# Patient Record
Sex: Male | Born: 1985 | Hispanic: No | Marital: Single | Smoking: Current every day smoker
Health system: Southern US, Community
[De-identification: ages and names within clinical notes are randomized; demographics above are authoritative.]

---

## 2015-04-05 ENCOUNTER — Encounter (HOSPITAL_COMMUNITY): Payer: Self-pay | Admitting: *Deleted

## 2015-04-05 ENCOUNTER — Emergency Department (HOSPITAL_COMMUNITY)
Admission: EM | Admit: 2015-04-05 | Discharge: 2015-04-06 | Disposition: A | Discharge status: Home / Self Care | Payer: Self-pay | Attending: Emergency Medicine | Admitting: Emergency Medicine

## 2015-04-05 DIAGNOSIS — N5089 Other specified disorders of the male genital organs: Secondary | ICD-10-CM

## 2015-04-05 DIAGNOSIS — N451 Epididymitis: Secondary | ICD-10-CM | POA: Insufficient documentation

## 2015-04-05 MED ORDER — MORPHINE SULFATE (PF) 4 MG/ML IV SOLN
4.0000 mg | Freq: Once | INTRAVENOUS | Status: AC
  Administered 2015-04-06: 4 mg via INTRAVENOUS
  Filled 2015-04-05: qty 1

## 2015-04-05 MED ORDER — ONDANSETRON HCL 4 MG/2ML IJ SOLN
4.0000 mg | Freq: Once | INTRAMUSCULAR | Status: AC
  Administered 2015-04-06: 4 mg via INTRAVENOUS
  Filled 2015-04-05: qty 2

## 2015-04-05 NOTE — ED Provider Notes (Signed)
CSN: [id number]     Arrival date & time 04/05/15  2322 History   First MD Initiated Contact with Patient 04/05/15 2326     Chief Complaint  Patient presents with  . Abdominal Pain  . Groin Pain     (Consider location/radiation/quality/duration/timing/severity/associated sxs/prior Treatment) HPI Comments: Pt comes in with c/o left testicle swelling and pain that started yesterday. No history of similar symptoms. No vomiting, diarrhea, fever or dysuria. Nothing makes the pain better. Denies penile discharge.  The history is provided by the patient. A language interpreter was used.    History reviewed. No pertinent past medical history. History reviewed. No pertinent past surgical history. No family history on file. Social History  Substance Use Topics  . Smoking status: Never Smoker   . Smokeless tobacco: Never Used  . Alcohol Use: No    Review of Systems  All other systems reviewed and are negative.     Allergies  Review of patient's allergies indicates no known allergies.  Home Medications   Prior to Admission medications   Not on File   BP 151/93 mmHg  Pulse 86  Temp(Src) 99.1 F (37.3 C) (Oral)  Resp 16  SpO2 93% Physical Exam  Constitutional: He is oriented to person, place, and time. He appears well-developed and well-nourished.  HENT:  Head: Normocephalic and atraumatic.  Cardiovascular: Normal rate and regular rhythm.   Pulmonary/Chest: Effort normal and breath sounds normal.  Abdominal: Soft. Bowel sounds are normal.  Left sided testicle pain with left abdominal palpation. No hernia noted  Genitourinary:  Swelling noted to the left testing. No penile discharge noted  Musculoskeletal: Normal range of motion.  Neurological: He is alert and oriented to person, place, and time.  Skin: Skin is warm and dry.  Psychiatric: He has a normal mood and affect.  Nursing note and vitals reviewed.   ED Course  Procedures (including critical care time) Labs  Review Labs Reviewed  CBC WITH DIFFERENTIAL/PLATELET - Abnormal; Notable for the following:    Monocytes Relative 14 (*)    Monocytes Absolute 1.2 (*)    All other components within normal limits  BASIC METABOLIC PANEL  URINALYSIS, ROUTINE W REFLEX MICROSCOPIC (NOT AT [facility])  HIV ANTIBODY (ROUTINE TESTING)  RPR  GC/CHLAMYDIA PROBE AMP ([place]) NOT AT [facility]    Imaging Review No results found. I have personally reviewed and evaluated these images and lab results as part of my medical decision-making.   EKG Interpretation None      MDM   Final diagnoses:  None    Pt left with Earley Favor NP for pending urine and [place] report    Teressa Lower, NP 04/06/15 [id number]  Derwood Kaplan, MD 04/07/15 [id number]

## 2015-04-05 NOTE — ED Notes (Signed)
Bed: ZO10 Expected date:  Expected time:  Means of arrival:  Comments: EMS 29yo M abd / groin pain

## 2015-04-05 NOTE — ED Notes (Signed)
Pt arrives to the ER via EMS for complaints of lower abd pain and testicle pain; pt states that the pain started yesterday around 1700 and has progressively gotten worse; pt reports left testicular swelling; tenderness to lower abd on palpation

## 2015-04-06 ENCOUNTER — Emergency Department (HOSPITAL_COMMUNITY): Payer: Self-pay

## 2015-04-06 LAB — URINALYSIS, ROUTINE W REFLEX MICROSCOPIC
BILIRUBIN URINE: NEGATIVE
Glucose, UA: NEGATIVE mg/dL
KETONES UR: NEGATIVE mg/dL
Leukocytes, UA: NEGATIVE
NITRITE: NEGATIVE
Protein, ur: NEGATIVE mg/dL
Specific Gravity, Urine: 1.009 (ref 1.005–1.030)
UROBILINOGEN UA: 0.2 mg/dL (ref 0.0–1.0)
pH: 6 (ref 5.0–8.0)

## 2015-04-06 LAB — CBC WITH DIFFERENTIAL/PLATELET
BASOS ABS: 0 10*3/uL (ref 0.0–0.1)
BASOS PCT: 0 % (ref 0–1)
EOS PCT: 1 % (ref 0–5)
Eosinophils Absolute: 0.1 10*3/uL (ref 0.0–0.7)
HCT: 43.5 % (ref 39.0–52.0)
Hemoglobin: 15.1 g/dL (ref 13.0–17.0)
Lymphocytes Relative: 20 % (ref 12–46)
Lymphs Abs: 1.7 10*3/uL (ref 0.7–4.0)
MCH: 30 pg (ref 26.0–34.0)
MCHC: 34.7 g/dL (ref 30.0–36.0)
MCV: 86.5 fL (ref 78.0–100.0)
MONO ABS: 1.2 10*3/uL — AB (ref 0.1–1.0)
MONOS PCT: 14 % — AB (ref 3–12)
Neutro Abs: 5.9 10*3/uL (ref 1.7–7.7)
Neutrophils Relative %: 65 % (ref 43–77)
PLATELETS: 205 10*3/uL (ref 150–400)
RBC: 5.03 MIL/uL (ref 4.22–5.81)
RDW: 12 % (ref 11.5–15.5)
WBC: 8.9 10*3/uL (ref 4.0–10.5)

## 2015-04-06 LAB — URINE MICROSCOPIC-ADD ON

## 2015-04-06 LAB — BASIC METABOLIC PANEL
ANION GAP: 9 (ref 5–15)
BUN: 6 mg/dL (ref 6–20)
CALCIUM: 9 mg/dL (ref 8.9–10.3)
CO2: 23 mmol/L (ref 22–32)
CREATININE: 0.89 mg/dL (ref 0.61–1.24)
Chloride: 104 mmol/L (ref 101–111)
GFR calc Af Amer: >60 mL/min (ref >60)
GLUCOSE: 94 mg/dL (ref 65–99)
Potassium: 3.7 mmol/L (ref 3.5–5.1)
Sodium: 136 mmol/L (ref 135–145)

## 2015-04-06 LAB — HIV ANTIBODY (ROUTINE TESTING W REFLEX): HIV SCREEN 4TH GENERATION: NONREACTIVE

## 2015-04-06 LAB — RPR: RPR: NONREACTIVE

## 2015-04-06 MED ORDER — DOXYCYCLINE HYCLATE 100 MG PO TABS: 100.0000 mg | ORAL_TABLET | Freq: Two times a day (BID) | ORAL | Status: DC

## 2015-04-06 MED ORDER — CEFTRIAXONE SODIUM 250 MG IJ SOLR
250.0000 mg | Freq: Once | INTRAMUSCULAR | Status: AC
  Administered 2015-04-06: 250 mg via INTRAMUSCULAR
  Filled 2015-04-06: qty 250

## 2015-04-06 MED ORDER — LIDOCAINE HCL 2 % IJ SOLN
INTRAMUSCULAR | Status: AC
  Administered 2015-04-06: 400 mg
  Filled 2015-04-06: qty 20

## 2015-04-06 MED ORDER — TRAMADOL HCL 50 MG PO TABS: 50.0000 mg | ORAL_TABLET | Freq: Four times a day (QID) | ORAL | Status: DC | PRN

## 2015-04-06 MED ORDER — DOXYCYCLINE HYCLATE 100 MG PO TABS
100.0000 mg | ORAL_TABLET | Freq: Once | ORAL | Status: AC
  Administered 2015-04-06: 100 mg via ORAL
  Filled 2015-04-06: qty 1

## 2015-04-06 NOTE — ED Provider Notes (Signed)
Patient's ultrasound shows that he has epididymitis on the left.  He then given IM Rocephin and the first dose of doxycycline in the emergency department.  He has been given a prescription for doxycycline and Ultram for pain control, as well as referral to urology for follow-up I given the patient.  Discharge instructions in [place], as well as diagnosis and follow-up instructions in Marlane Mingle, NP 04/06/15 [id number]  Earley Favor, NP 04/06/15 5409  Marisa Severin, MD 04/06/15 [phone]

## 2015-04-06 NOTE — Discharge Instructions (Signed)
Epididymitis Epididymitis is a swelling (inflammation) of the epididymis. The epididymis is a cord-like structure along the back part of the testicle. Epididymitis is usually, but not always, caused by infection. This is usually a sudden problem beginning with chills, fever and pain behind the scrotum and in the testicle. There may be swelling and redness of the testicle. DIAGNOSIS  Physical examination will reveal a tender, swollen epididymis. Sometimes, cultures are obtained from the urine or from prostate secretions to help find out if there is an infection or if the cause is a different problem. Sometimes, blood work is performed to see if your white blood cell count is elevated and if a germ (bacterial) or viral infection is present. Using this knowledge, an appropriate medicine which kills germs (antibiotic) can be chosen by your caregiver. A viral infection causing epididymitis will most often go away (resolve) without treatment. HOME CARE INSTRUCTIONS   Hot sitz baths for 20 minutes, 4 times per day, may help relieve pain.  Only take over-the-counter or prescription medicines for pain, discomfort or fever as directed by your caregiver.  Take all medicines, including antibiotics, as directed. Take the antibiotics for the full prescribed length of time even if you are feeling better.  It is very important to keep all follow-up appointments. SEEK IMMEDIATE MEDICAL CARE IF:   You have a fever.  You have pain not relieved with medicines.  You have any worsening of your problems.  Your pain seems to come and go.  You develop pain, redness, and swelling in the scrotum and surrounding areas. MAKE SURE YOU:   Understand these instructions.  Will watch your condition.  Will get help right away if you are not doing well or get worse. Document Released: 07/30/2000 Document Revised: 10/25/2011 Document Reviewed: 06/19/2009 [facility] Patient Information 2015 [place], [place]. This information  is not intended to replace advice given to you by your health care provider. Make sure you discuss any questions you have with your health care provider. Ultrasound shows that you have epididymitis of the left testicle.  You have  Epididymitis Epididymitis is a swelling (inflammation) of the epididymis. The epididymis is a cord-like structure along the back part of the testicle. Epididymitis is usually, but not always, caused by infection. This is usually a sudden problem beginning with chills, fever and pain behind the scrotum and in the testicle. There may be swelling and redness of the testicle. DIAGNOSIS  Physical examination will reveal a tender, swollen epididymis. Sometimes, cultures are obtained from the urine or from prostate secretions to help find out if there is an infection or if the cause is a different problem. Sometimes, blood work is performed to see if your white blood cell count is elevated and if a germ (bacterial) or viral infection is present. Using this knowledge, an appropriate medicine which kills germs (antibiotic) can be chosen by your caregiver. A viral infection causing epididymitis will most often go away (resolve) without treatment. HOME CARE INSTRUCTIONS   Hot sitz baths for 20 minutes, 4 times per day, may help relieve pain.  Only take over-the-counter or prescription medicines for pain, discomfort or fever as directed by your caregiver.  Take all medicines, including antibiotics, as directed. Take the antibiotics for the full prescribed length of time even if you are feeling better.  It is very important to keep all follow-up appointments. SEEK IMMEDIATE MEDICAL CARE IF:   You have a fever.  You have pain not relieved with medicines.  You have any  worsening of your problems.  Your pain seems to come and go.  You develop pain, redness, and swelling in the scrotum and surrounding areas. MAKE SURE YOU:   Understand these instructions.  Will watch your  condition.  Will get help right away if you are not doing well or get worse. Document Released: 07/30/2000 Document Revised: 10/25/2011 Document Reviewed: 06/19/2009 [facility] Patient Information 2015 [place], [place]. This information is not intended to replace advice given to you by your health care provider. Make sure you discuss any questions you have with your health care provider.  been started on anti-biotics and given referral to urology for follow-up.  Please call on Monday to make an appointment.

## 2017-05-27 ENCOUNTER — Emergency Department (HOSPITAL_COMMUNITY)
Admission: EM | Admit: 2017-05-27 | Discharge: 2017-05-28 | Disposition: A | Discharge status: Home / Self Care | Payer: Self-pay | Attending: Emergency Medicine | Admitting: Emergency Medicine

## 2017-05-27 ENCOUNTER — Encounter (HOSPITAL_COMMUNITY): Payer: Self-pay | Admitting: Emergency Medicine

## 2017-05-27 DIAGNOSIS — R600 Localized edema: Secondary | ICD-10-CM | POA: Insufficient documentation

## 2017-05-27 DIAGNOSIS — Z5321 Procedure and treatment not carried out due to patient leaving prior to being seen by health care provider: Secondary | ICD-10-CM | POA: Insufficient documentation
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
## 2017-05-27 NOTE — ED Notes (Signed)
Pt became very upset he had to wait b/c he was sick and hurting.  RN explained that everybody was hurting.  He walked off and came back several minutes later handed me his stickers and said "have a good evening."

## 2017-12-30 ENCOUNTER — Other Ambulatory Visit: Payer: Self-pay

## 2017-12-30 ENCOUNTER — Encounter (HOSPITAL_COMMUNITY): Payer: Self-pay | Admitting: Emergency Medicine

## 2017-12-30 ENCOUNTER — Emergency Department (HOSPITAL_COMMUNITY)
Admission: EM | Admit: 2017-12-30 | Discharge: 2017-12-30 | Disposition: A | Discharge status: Home / Self Care | Payer: Self-pay | Attending: Emergency Medicine | Admitting: Emergency Medicine

## 2017-12-30 DIAGNOSIS — Z5321 Procedure and treatment not carried out due to patient leaving prior to being seen by health care provider: Secondary | ICD-10-CM | POA: Insufficient documentation

## 2017-12-30 DIAGNOSIS — R109 Unspecified abdominal pain: Secondary | ICD-10-CM | POA: Insufficient documentation

## 2017-12-30 LAB — CBC
HEMATOCRIT: 46 % (ref 39.0–52.0)
HEMOGLOBIN: 15.4 g/dL (ref 13.0–17.0)
MCH: 29.6 pg (ref 26.0–34.0)
MCHC: 33.5 g/dL (ref 30.0–36.0)
MCV: 88.5 fL (ref 78.0–100.0)
Platelets: 261 10*3/uL (ref 150–400)
RBC: 5.2 MIL/uL (ref 4.22–5.81)
RDW: 12.3 % (ref 11.5–15.5)
WBC: 4.4 10*3/uL (ref 4.0–10.5)

## 2017-12-30 LAB — URINALYSIS, ROUTINE W REFLEX MICROSCOPIC
BACTERIA UA: NONE SEEN
Bilirubin Urine: NEGATIVE
Glucose, UA: NEGATIVE mg/dL
Ketones, ur: NEGATIVE mg/dL
Leukocytes, UA: NEGATIVE
Nitrite: NEGATIVE
Protein, ur: NEGATIVE mg/dL
SPECIFIC GRAVITY, URINE: 1.012 (ref 1.005–1.030)
pH: 7 (ref 5.0–8.0)

## 2017-12-30 LAB — COMPREHENSIVE METABOLIC PANEL
ALBUMIN: 4.3 g/dL (ref 3.5–5.0)
ALK PHOS: 48 U/L (ref 38–126)
ALT: 37 U/L (ref 17–63)
ANION GAP: 9 (ref 5–15)
AST: 51 U/L — ABNORMAL HIGH (ref 15–41)
BUN: <5 mg/dL — ABNORMAL LOW (ref 6–20)
CALCIUM: 9.2 mg/dL (ref 8.9–10.3)
CO2: 27 mmol/L (ref 22–32)
Chloride: 104 mmol/L (ref 101–111)
Creatinine, Ser: 0.98 mg/dL (ref 0.61–1.24)
GFR calc Af Amer: >60 mL/min (ref >60)
GFR calc non Af Amer: >60 mL/min (ref >60)
GLUCOSE: 100 mg/dL — AB (ref 65–99)
Potassium: 3.9 mmol/L (ref 3.5–5.1)
SODIUM: 140 mmol/L (ref 135–145)
Total Bilirubin: 0.7 mg/dL (ref 0.3–1.2)
Total Protein: 7 g/dL (ref 6.5–8.1)

## 2017-12-30 LAB — LIPASE, BLOOD: Lipase: 33 U/L (ref 11–51)

## 2017-12-30 NOTE — ED Notes (Signed)
Pt not found in lobby. Called x3, no response.

## 2017-12-30 NOTE — ED Notes (Signed)
No response when called for vitals re-check at 11:37.

## 2017-12-30 NOTE — ED Triage Notes (Signed)
Patient presents to the ED from work, reports he has had Abdominal pain since 0630. Patient eports he ate [place]  [employer] last night and that when it started. Patient reports vomiting x1 denies any BM.

## 2018-07-05 ENCOUNTER — Encounter (HOSPITAL_COMMUNITY): Payer: Self-pay | Admitting: Emergency Medicine

## 2018-07-05 ENCOUNTER — Emergency Department (HOSPITAL_COMMUNITY): Payer: Self-pay

## 2018-07-05 ENCOUNTER — Emergency Department (HOSPITAL_COMMUNITY)
Admission: EM | Admit: 2018-07-05 | Discharge: 2018-07-05 | Disposition: A | Discharge status: Home / Self Care | Payer: Self-pay | Attending: Emergency Medicine | Admitting: Emergency Medicine

## 2018-07-05 DIAGNOSIS — H1131 Conjunctival hemorrhage, right eye: Secondary | ICD-10-CM | POA: Insufficient documentation

## 2018-07-05 DIAGNOSIS — Y999 Unspecified external cause status: Secondary | ICD-10-CM | POA: Insufficient documentation

## 2018-07-05 DIAGNOSIS — S161XXA Strain of muscle, fascia and tendon at neck level, initial encounter: Secondary | ICD-10-CM | POA: Insufficient documentation

## 2018-07-05 DIAGNOSIS — Y939 Activity, unspecified: Secondary | ICD-10-CM | POA: Insufficient documentation

## 2018-07-05 DIAGNOSIS — S0240EA Zygomatic fracture, right side, initial encounter for closed fracture: Secondary | ICD-10-CM | POA: Insufficient documentation

## 2018-07-05 DIAGNOSIS — S0990XA Unspecified injury of head, initial encounter: Secondary | ICD-10-CM

## 2018-07-05 DIAGNOSIS — S060X9A Concussion with loss of consciousness of unspecified duration, initial encounter: Secondary | ICD-10-CM | POA: Insufficient documentation

## 2018-07-05 DIAGNOSIS — Y929 Unspecified place or not applicable: Secondary | ICD-10-CM | POA: Insufficient documentation
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
## 2018-07-05 NOTE — ED Notes (Signed)
Patient returned for evaluation with [employer] in attendance.
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
## 2018-07-05 NOTE — ED Notes (Signed)
Patient becoming loud and cussing at staff, stating "you are not fucking helping me" "I need to call my job".  This RN spoke to patient who would not listen to reason and continued to yell at this RN.  Patient was asked to leave if he did not want help.  He then preceded to sit down and cooperate.  Patient became disruptive again, asked to leave again.  Patient would not leave, was placed in handcuffs and escorted out.

## 2018-07-05 NOTE — ED Notes (Signed)
Patient transported to CT 

## 2019-02-24 ENCOUNTER — Emergency Department (HOSPITAL_COMMUNITY): Payer: Self-pay

## 2019-02-24 ENCOUNTER — Emergency Department (HOSPITAL_COMMUNITY)
Admission: EM | Admit: 2019-02-24 | Discharge: 2019-02-25 | Disposition: A | Discharge status: Home / Self Care | Payer: Self-pay | Attending: Emergency Medicine | Admitting: Emergency Medicine

## 2019-02-24 ENCOUNTER — Encounter (HOSPITAL_COMMUNITY): Payer: Self-pay

## 2019-02-24 ENCOUNTER — Other Ambulatory Visit: Payer: Self-pay

## 2019-02-24 DIAGNOSIS — Y999 Unspecified external cause status: Secondary | ICD-10-CM | POA: Insufficient documentation

## 2019-02-24 DIAGNOSIS — T07XXXA Unspecified multiple injuries, initial encounter: Secondary | ICD-10-CM

## 2019-02-24 DIAGNOSIS — S80812A Abrasion, left lower leg, initial encounter: Secondary | ICD-10-CM | POA: Insufficient documentation

## 2019-02-24 DIAGNOSIS — S0990XA Unspecified injury of head, initial encounter: Secondary | ICD-10-CM | POA: Insufficient documentation

## 2019-02-24 DIAGNOSIS — S50811A Abrasion of right forearm, initial encounter: Secondary | ICD-10-CM | POA: Insufficient documentation

## 2019-02-24 DIAGNOSIS — S80811A Abrasion, right lower leg, initial encounter: Secondary | ICD-10-CM | POA: Insufficient documentation

## 2019-02-24 DIAGNOSIS — S0512XA Contusion of eyeball and orbital tissues, left eye, initial encounter: Secondary | ICD-10-CM | POA: Insufficient documentation

## 2019-02-24 DIAGNOSIS — Z23 Encounter for immunization: Secondary | ICD-10-CM | POA: Insufficient documentation

## 2019-02-24 DIAGNOSIS — S50812A Abrasion of left forearm, initial encounter: Secondary | ICD-10-CM | POA: Insufficient documentation

## 2019-02-24 DIAGNOSIS — F17210 Nicotine dependence, cigarettes, uncomplicated: Secondary | ICD-10-CM | POA: Insufficient documentation

## 2019-02-24 DIAGNOSIS — Y939 Activity, unspecified: Secondary | ICD-10-CM | POA: Insufficient documentation

## 2019-02-24 DIAGNOSIS — H538 Other visual disturbances: Secondary | ICD-10-CM | POA: Insufficient documentation

## 2019-02-24 DIAGNOSIS — S0181XA Laceration without foreign body of other part of head, initial encounter: Secondary | ICD-10-CM

## 2019-02-24 DIAGNOSIS — S81811A Laceration without foreign body, right lower leg, initial encounter: Secondary | ICD-10-CM | POA: Insufficient documentation

## 2019-02-24 DIAGNOSIS — S01112A Laceration without foreign body of left eyelid and periocular area, initial encounter: Secondary | ICD-10-CM | POA: Insufficient documentation

## 2019-02-24 DIAGNOSIS — Y929 Unspecified place or not applicable: Secondary | ICD-10-CM | POA: Insufficient documentation

## 2019-02-24 DIAGNOSIS — S0083XA Contusion of other part of head, initial encounter: Secondary | ICD-10-CM | POA: Insufficient documentation

## 2019-02-24 DIAGNOSIS — S022XXA Fracture of nasal bones, initial encounter for closed fracture: Secondary | ICD-10-CM | POA: Insufficient documentation

## 2019-02-24 LAB — CBC WITH DIFFERENTIAL/PLATELET
Abs Immature Granulocytes: 0.01 10*3/uL (ref 0.00–0.07)
Basophils Absolute: 0.1 10*3/uL (ref 0.0–0.1)
Basophils Relative: 1 %
Eosinophils Absolute: 0 10*3/uL (ref 0.0–0.5)
Eosinophils Relative: 0 %
HCT: 45.2 % (ref 39.0–52.0)
Hemoglobin: 14.8 g/dL (ref 13.0–17.0)
Immature Granulocytes: 0 %
Lymphocytes Relative: 20 %
Lymphs Abs: 1.4 10*3/uL (ref 0.7–4.0)
MCH: 29.5 pg (ref 26.0–34.0)
MCHC: 32.7 g/dL (ref 30.0–36.0)
MCV: 90.2 fL (ref 80.0–100.0)
Monocytes Absolute: 0.6 10*3/uL (ref 0.1–1.0)
Monocytes Relative: 8 %
Neutro Abs: 5.2 10*3/uL (ref 1.7–7.7)
Neutrophils Relative %: 71 %
Platelets: 255 10*3/uL (ref 150–400)
RBC: 5.01 MIL/uL (ref 4.22–5.81)
RDW: 13.2 % (ref 11.5–15.5)
WBC: 7.3 10*3/uL (ref 4.0–10.5)
nRBC: 0 % (ref 0.0–0.2)

## 2019-02-24 LAB — BASIC METABOLIC PANEL
Anion gap: 13 (ref 5–15)
BUN: 7 mg/dL (ref 6–20)
CO2: 22 mmol/L (ref 22–32)
Calcium: 8.9 mg/dL (ref 8.9–10.3)
Chloride: 106 mmol/L (ref 98–111)
Creatinine, Ser: 1.17 mg/dL (ref 0.61–1.24)
GFR calc Af Amer: >60 mL/min (ref >60)
GFR calc non Af Amer: >60 mL/min (ref >60)
Glucose, Bld: 100 mg/dL — ABNORMAL HIGH (ref 70–99)
Potassium: 4 mmol/L (ref 3.5–5.1)
Sodium: 141 mmol/L (ref 135–145)

## 2019-02-24 MED ORDER — TETRACAINE HCL 0.5 % OP SOLN
2.0000 [drp] | Freq: Once | OPHTHALMIC | Status: AC
  Administered 2019-02-24: 2 [drp] via OPHTHALMIC
  Filled 2019-02-24: qty 4

## 2019-02-24 MED ORDER — TETANUS-DIPHTH-ACELL PERTUSSIS 5-2.5-18.5 LF-MCG/0.5 IM SUSP
0.5000 mL | Freq: Once | INTRAMUSCULAR | Status: AC
  Administered 2019-02-24: 0.5 mL via INTRAMUSCULAR
  Filled 2019-02-24: qty 0.5

## 2019-02-24 MED ORDER — FLUORESCEIN SODIUM 1 MG OP STRP
1.0000 | ORAL_STRIP | Freq: Once | OPHTHALMIC | Status: AC
  Administered 2019-02-24: 1 via OPHTHALMIC
  Filled 2019-02-24: qty 1

## 2019-02-24 MED ORDER — SODIUM CHLORIDE 0.9 % IV BOLUS
1000.0000 mL | Freq: Once | INTRAVENOUS | Status: AC
  Administered 2019-02-24: 21:00:00 1000 mL via INTRAVENOUS

## 2019-02-24 MED ORDER — LIDOCAINE-EPINEPHRINE (PF) 2 %-1:200000 IJ SOLN
10.0000 mL | Freq: Once | INTRAMUSCULAR | Status: AC
  Administered 2019-02-24: 10 mL
  Filled 2019-02-24: qty 20

## 2019-02-24 MED ORDER — LIDOCAINE-EPINEPHRINE-TETRACAINE (LET) SOLUTION
3.0000 mL | Freq: Once | NASAL | Status: AC
  Administered 2019-02-24: 3 mL via TOPICAL
  Filled 2019-02-24: qty 3
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
## 2019-02-24 NOTE — ED Notes (Signed)
Provider at bedside
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
## 2019-02-25 MED ORDER — ERYTHROMYCIN 5 MG/GM OP OINT: TOPICAL_OINTMENT | OPHTHALMIC | 0 refills | Status: AC
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
## 2019-04-05 ENCOUNTER — Other Ambulatory Visit: Payer: Self-pay

## 2019-04-05 ENCOUNTER — Emergency Department (HOSPITAL_COMMUNITY)
Admission: EM | Admit: 2019-04-05 | Discharge: 2019-04-05 | Disposition: A | Discharge status: Home / Self Care | Payer: Self-pay | Attending: Emergency Medicine | Admitting: Emergency Medicine

## 2019-04-05 DIAGNOSIS — Z4802 Encounter for removal of sutures: Secondary | ICD-10-CM | POA: Insufficient documentation

## 2019-04-05 NOTE — ED Provider Notes (Signed)
[facility]   CSN: [id number] Arrival date & time: 04/05/19  1558     History   Chief Complaint Chief Complaint  Patient presents with  . Suture / Staple Removal    HPI Eugene Gonzalez is a 33 y.o. male.     HPI   33 year old male presenting for suture removal.  Had sutures placed on 03/27/2019 but has not been able to follow-up due to being busy with work.  No fevers.  No redness, swelling, increased pain or drainage from the wound.  States is been healing well.  No past medical history on file.  There are no active problems to display for this patient.   No past surgical history on file.      Home Medications    Prior to Admission medications   Medication Sig Start Date End Date Taking? Authorizing Provider  erythromycin ophthalmic ointment Place a 1/2 inch ribbon of ointment into the lower eyelid 4 times daily for 5 days. 02/25/19   Bill SalinasMorelli, Brandon A, PA-C    Family History No family history on file.  Social History Social History   Tobacco Use  . Smoking status: Current Every Day Smoker    Packs/day: 1.00    Types: Cigarettes  . Smokeless tobacco: Never Used  Substance Use Topics  . Alcohol use: No  . Drug use: No     Allergies   Patient has no known allergies.   Review of Systems Review of Systems  Constitutional: Negative for fever.  Skin: Positive for wound (well healing).     Physical Exam Updated Vital Signs BP 119/67 (BP Location: Right Arm)   Pulse 68   Temp 98.7 F (37.1 C) (Oral)   SpO2 100%   Physical Exam Constitutional:      General: He is not in acute distress.    Appearance: He is well-developed.  Eyes:     Conjunctiva/sclera: Conjunctivae normal.  Cardiovascular:     Rate and Rhythm: Normal rate and regular rhythm.  Pulmonary:     Effort: Pulmonary effort is normal.     Breath sounds: Normal breath sounds.  Skin:    General: Skin is warm and dry.   Comments: Healed wound to the left eyebrow.  Sutures in place.  No warmth, erythema, induration or fluctuance.  Neurological:     Mental Status: He is alert and oriented to person, place, and time.      ED Treatments / Results  Labs (all labs ordered are listed, but only abnormal results are displayed) Labs Reviewed - No data to display  EKG None  Radiology No results found.  Procedures Procedures (including critical care time)  Medications Ordered in ED Medications - No data to display   Initial Impression / Assessment and Plan / ED Course  I have reviewed the triage vital signs and the nursing notes.  Pertinent labs & imaging results that were available during my care of the patient were reviewed by me and considered in my medical decision making (see chart for details).      Final Clinical Impressions(s) / ED Diagnoses   Final diagnoses:  Visit for suture removal   Pt to ER for staple/suture removal and wound check as above. Procedure tolerated well. Vitals normal, no signs of infection. Scar minimization & return precautions given at dc.    ED Discharge Orders    None       Karrie MeresCouture, Jewel Mcafee S, [place] JerseyPA-C 04/05/19 1648  Blanchie Dessert, MD 04/05/19 [phone]

## 2019-04-05 NOTE — ED Triage Notes (Signed)
Pt here for suture removal. Pt states sutures have been in place x3 weeks. Work has prevented him from having them removed before today.

## 2019-04-21 ENCOUNTER — Emergency Department (HOSPITAL_COMMUNITY)
Admission: EM | Admit: 2019-04-21 | Discharge: 2019-04-21 | Disposition: A | Discharge status: Home / Self Care | Payer: Self-pay | Attending: Emergency Medicine | Admitting: Emergency Medicine

## 2019-04-21 ENCOUNTER — Emergency Department (HOSPITAL_COMMUNITY): Payer: Self-pay

## 2019-04-21 ENCOUNTER — Encounter (HOSPITAL_COMMUNITY): Payer: Self-pay | Admitting: Emergency Medicine

## 2019-04-21 DIAGNOSIS — M7021 Olecranon bursitis, right elbow: Secondary | ICD-10-CM

## 2019-04-21 DIAGNOSIS — F17210 Nicotine dependence, cigarettes, uncomplicated: Secondary | ICD-10-CM | POA: Insufficient documentation

## 2019-04-21 DIAGNOSIS — Y939 Activity, unspecified: Secondary | ICD-10-CM | POA: Insufficient documentation

## 2019-04-21 MED ORDER — NAPROXEN 500 MG PO TABS: 500.0000 mg | ORAL_TABLET | Freq: Two times a day (BID) | ORAL | 0 refills | Status: AC

## 2019-04-21 NOTE — ED Provider Notes (Signed)
[facility] EMERGENCY DEPARTMENT Provider Note   CSN: [id number] Arrival date & time: 04/21/19  1743     History   Chief Complaint Chief Complaint  Patient presents with  . Joint Swelling    HPI Eugene Gonzalez is a 33 y.o. male presenting for evaluation of left elbow swelling.  Patient states the past 2 weeks, he has been having persistent and gradually worsening left elbow swelling.  This began after he fell and landed on his elbow.  He reports initially he was having no pain, but he has slight pain now at the site of the olecranon.  He denies pain or injury elsewhere.  He denies numbness or tingling.  He has not taken anything for this including Tylenol ibuprofen.  He denies history of similar.  He denies fevers, chills, nausea, or vomiting.  No history of diabetes.     HPI  History reviewed. No pertinent past medical history.  There are no active problems to display for this patient.   History reviewed. No pertinent surgical history.      Home Medications    Prior to Admission medications   Medication Sig Start Date End Date Taking? Authorizing Provider  erythromycin ophthalmic ointment Place a 1/2 inch ribbon of ointment into the lower eyelid 4 times daily for 5 days. 02/25/19   Nuala Alpha A, PA-C  naproxen (NAPROSYN) 500 MG tablet Take 1 tablet (500 mg total) by mouth 2 (two) times daily with a meal. 04/21/19   Theodis Kinsel, PA-C    Family History History reviewed. No pertinent family history.  Social History Social History   Tobacco Use  . Smoking status: Current Every Day Smoker    Packs/day: 1.00    Types: Cigarettes  . Smokeless tobacco: Never Used  Substance Use Topics  . Alcohol use: No  . Drug use: No     Allergies   Patient has no known allergies.   Review of Systems Review of Systems  Musculoskeletal: Positive for arthralgias.  Neurological: Negative for numbness.     Physical Exam Updated Vital  Signs BP 138/68 (BP Location: Right Arm)   Pulse (!) 48   Temp 98.4 F (36.9 C) (Oral)   Resp 17   SpO2 100%   Physical Exam Vitals signs and nursing note reviewed.  Constitutional:      General: He is not in acute distress.    Appearance: He is well-developed.     Comments: Sitting comfortably in the bed in no acute distress  HENT:     Head: Normocephalic and atraumatic.  Neck:     Musculoskeletal: Normal range of motion.  Cardiovascular:     Rate and Rhythm: Normal rate.  Pulmonary:     Effort: Pulmonary effort is normal.  Abdominal:     General: There is no distension.  Musculoskeletal:        General: Swelling present.     Comments: Swelling over the olecranon of the right elbow.  No erythema or warmth.  No significant tenderness.  Full active range of motion of the elbow without difficulty.  Radial pulses intact. Grip strength intact.  No swelling noted elsewhere.  Skin:    General: Skin is warm.     Capillary Refill: Capillary refill takes less than 2 seconds.     Findings: No rash.  Neurological:     Mental Status: He is alert and oriented to person, place, and time.      ED Treatments / Results  Labs (all labs ordered are listed, but only abnormal results are displayed) Labs Reviewed - No data to display  EKG None  Radiology Dg Elbow Complete Right  Result Date: 04/21/2019 CLINICAL DATA:  Right elbow pain. No reported injury. History of arthritis and gout. EXAM: RIGHT ELBOW - COMPLETE 3+ VIEW COMPARISON:  None. FINDINGS: There is no evidence of fracture, dislocation, or joint effusion. There is no evidence of arthropathy or other focal bone abnormality. Marked posterior soft tissue swelling. IMPRESSION: Marked posterior soft tissue swelling without underlying bony abnormality. This is most likely due to olecranon bursitis. Electronically Signed   By: Beckie SaltsSteven  Reid M.D.   On: 04/21/2019 19:27    Procedures Procedures (including critical care time)   Medications Ordered in ED Medications - No data to display   Initial Impression / Assessment and Plan / ED Course  I have reviewed the triage vital signs and the nursing notes.  Pertinent labs & imaging results that were available during my care of the patient were reviewed by me and considered in my medical decision making (see chart for details).        Presenting for evaluation of right elbow swelling.  Physical exam reassuring, he is neurovascularly intact.  No signs of septic joint, as patient is without fever, difficulty with ranging, or significant tenderness over the elbow.  Likely olecranon bursitis.  However is incident occurred after fall/injury, will obtain x-rays to rule out occult fracture.  X-rays viewed interpreted by me, no fracture or dislocation.  X-rays show soft tissue swelling consistent with lacunae bursitis.  Will treat with NSAIDs, compression, ice.  Discussed with patient, who is agreeable to plan.  At this time, patient appears safe for discharge.  return precautions given. patient states he understands and agrees to plan.  Final Clinical Impressions(s) / ED Diagnoses   Final diagnoses:  Olecranon bursitis of right elbow    ED Discharge Orders         Ordered    naproxen (NAPROSYN) 500 MG tablet  2 times daily with meals     04/21/19 1935           Alveria ApleyCaccavale, Cleopha Indelicato, PA-C 04/21/19 1937    Alvira MondaySchlossman, Erin, MD 04/24/19 2326

## 2019-04-21 NOTE — ED Triage Notes (Signed)
Pt here with with c/o right elbow pain from  Work , pt has hx of arthritis and gout

## 2019-04-21 NOTE — Discharge Instructions (Addendum)
Take naproxen 2 times a day with meals.  Do not take other anti-inflammatories at the same time (Advil, Motrin, ibuprofen, Aleve). You may supplement with Tylenol if you need further pain control. Use ice packs for pain and swelling. Use the Ace wrap to help with compression. Do not rest on your elbow, this can cause worsening swelling. Follow-up with the doctor listed below if your symptoms not improving. Return to the emergency room if you develop high fevers, inability to move your elbow, numbness, any new, worsening, concerning symptoms.

## 2019-04-25 ENCOUNTER — Emergency Department (HOSPITAL_COMMUNITY)
Admission: EM | Admit: 2019-04-25 | Discharge: 2019-04-25 | Discharge status: Against Medical Advice | Payer: Self-pay | Attending: Emergency Medicine | Admitting: Emergency Medicine

## 2019-04-25 ENCOUNTER — Encounter (HOSPITAL_COMMUNITY): Payer: Self-pay | Admitting: Emergency Medicine

## 2019-04-25 ENCOUNTER — Emergency Department (HOSPITAL_COMMUNITY): Payer: Self-pay

## 2019-04-25 ENCOUNTER — Other Ambulatory Visit: Payer: Self-pay

## 2019-04-25 ENCOUNTER — Emergency Department (HOSPITAL_COMMUNITY)
Admission: EM | Admit: 2019-04-25 | Discharge: 2019-04-26 | Discharge status: Against Medical Advice | Payer: Self-pay | Attending: Emergency Medicine | Admitting: Emergency Medicine

## 2019-04-25 DIAGNOSIS — Z5321 Procedure and treatment not carried out due to patient leaving prior to being seen by health care provider: Secondary | ICD-10-CM | POA: Insufficient documentation

## 2019-04-25 NOTE — ED Notes (Signed)
Security called this tech over to pt. Pt appeared to have lowered himself onto the ground to sleep. When approached pt began rolling and moaning. When pt was asked to get up pt was able to stand and ambulate to wheelchair. Pt was here previously being uncooperative and escorted out. This tech informed pt that he cannot roll on the ground or he will have to leave. Charge informed.

## 2019-04-25 NOTE — ED Notes (Signed)
After pt got registered and went to wait for triage he began yelling and cursing on the phone.  Off duty [employer] spoke to pt and asked him not to curse because of other people in waiting area.  PT got louder and started cursing at [facility] to leave him alone.  Other patients moved away from pt due to safety concerns.  PT refusing to keep mask and states he is going to another hospital.  PT got up out of wheelchair and sat in floor.  This RN and 2 EMTs went to assist pt and he stood up and got back in wheelchair.  This RN asked him if he wanted to stay to be seen and he said yes.  Informed pt that he cannot continue to yell and curse and that he would need to keep his mask on.  Pt agreed.
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
## 2019-04-25 NOTE — ED Notes (Signed)
Pt has continued to yell and curse in waiting room after being asked multiple times by staff and [employer] to refrain from yelling the f-word while on the phone.  Pt continues to yell and have outburst with [employer].  Security and [employer] escorted pt off property.

## 2019-04-25 NOTE — ED Notes (Addendum)
Pt continuously yelling and cussing. Pt told to lower voice and to not use that language.

## 2019-04-25 NOTE — ED Triage Notes (Signed)
Patient fell from a ladder while installing light this evening , reports pain at left ankle with mild swelling.

## 2019-04-25 NOTE — ED Triage Notes (Signed)
Pt arrives to ED with complaints of left ankle and lower back pain. EMS reports the patient was jumping from the second floor of a building and landed on his left side. Pt was given 156mcg of Fentanyl by [facility]. Pt here earlier in the day with same complaint, Xrays were done.

## 2019-04-26 NOTE — ED Notes (Signed)
This tech has asked pt to lower voice and stop cursing 3 times. Pt informed that he will have to leave if it happens again.

## 2019-04-26 NOTE — ED Notes (Signed)
Pt continuously yelling and cussing at this tech. This tech asked security to escort him out. Charge informed.

## 2019-05-07 ENCOUNTER — Other Ambulatory Visit: Payer: Self-pay

## 2019-05-07 ENCOUNTER — Encounter (HOSPITAL_COMMUNITY): Payer: Self-pay | Admitting: Emergency Medicine

## 2019-05-07 ENCOUNTER — Emergency Department (HOSPITAL_COMMUNITY)
Admission: EM | Admit: 2019-05-07 | Discharge: 2019-05-07 | Disposition: A | Discharge status: Home / Self Care | Payer: Self-pay | Attending: Emergency Medicine | Admitting: Emergency Medicine

## 2019-05-07 ENCOUNTER — Emergency Department (HOSPITAL_COMMUNITY): Payer: Self-pay

## 2019-05-07 DIAGNOSIS — W208XXA Other cause of strike by thrown, projected or falling object, initial encounter: Secondary | ICD-10-CM | POA: Insufficient documentation

## 2019-05-07 DIAGNOSIS — Y939 Activity, unspecified: Secondary | ICD-10-CM | POA: Insufficient documentation

## 2019-05-07 DIAGNOSIS — F17210 Nicotine dependence, cigarettes, uncomplicated: Secondary | ICD-10-CM | POA: Insufficient documentation

## 2019-05-07 DIAGNOSIS — S93402A Sprain of unspecified ligament of left ankle, initial encounter: Secondary | ICD-10-CM | POA: Insufficient documentation

## 2019-05-07 DIAGNOSIS — Y9259 Other trade areas as the place of occurrence of the external cause: Secondary | ICD-10-CM | POA: Insufficient documentation

## 2019-05-07 DIAGNOSIS — Y990 Civilian activity done for income or pay: Secondary | ICD-10-CM | POA: Insufficient documentation

## 2019-05-07 MED ORDER — IBUPROFEN 800 MG PO TABS
800.0000 mg | ORAL_TABLET | Freq: Once | ORAL | Status: AC
  Administered 2019-05-07: 800 mg via ORAL
  Filled 2019-05-07: qty 1

## 2019-05-07 MED ORDER — IBUPROFEN 800 MG PO TABS: 800.0000 mg | ORAL_TABLET | Freq: Three times a day (TID) | ORAL | 0 refills | Status: AC

## 2019-05-07 NOTE — ED Triage Notes (Signed)
Pt. Stated, I hurt my left ankle/foot 2 weeks ago at work and its still so much pain. The pain medication is not working.

## 2019-05-07 NOTE — ED Provider Notes (Signed)
[facility] EMERGENCY DEPARTMENT Provider Note   CSN: [id number] Arrival date & time: 05/07/19  1726     History   Chief Complaint Chief Complaint  Patient presents with  . 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     HPI   33 year old male presents today with complaints of left-sided ankle pain.  Patient notes he dropped something on it at work earlier this month.  He notes he was evaluated in the emergency room and has been wearing a splint since that time.  He notes pain and swelling to the ankle mostly along the medial aspect.  He denies any other new injuries.  He notes using ibuprofen without dramatic improvement.  Chart review shows patient was escorted off the property as he was cussing and yelling at nursing staff.  At his bedside he has a EMS blue splint that he has been wearing.  History reviewed. No pertinent past medical history.  There are no active problems to display for this patient.   History reviewed. No pertinent surgical history.      Home Medications    Prior to Admission medications   Medication Sig Start Date End Date Taking? Authorizing Provider  erythromycin ophthalmic ointment Place a 1/2 inch ribbon of ointment into the lower eyelid 4 times daily for 5 days. 02/25/19   Harlene Salts A, PA-C  ibuprofen (ADVIL) 800 MG tablet Take 1 tablet (800 mg total) by mouth 3 (three) times daily. 05/07/19   Christoffer Currier, Tinnie Gens, PA-C  naproxen (NAPROSYN) 500 MG tablet Take 1 tablet (500 mg total) by mouth 2 (two) times daily with a meal. 04/21/19   Caccavale, Sophia, PA-C    Family History No family history on file.  Social History Social History   Tobacco Use  . Smoking status: Current Every Day Smoker    Packs/day: 1.00    Types: Cigarettes  . Smokeless tobacco: Never Used  Substance Use Topics  . Alcohol use: No  . Drug use: No     Allergies   Patient has no known allergies.   Review of Systems Review  of Systems  All other systems reviewed and are negative.    Physical Exam Updated Vital Signs BP 129/80 (BP Location: Left Arm)   Pulse 65   Temp 98.3 F (36.8 C) (Oral)   Resp 17   Physical Exam Vitals signs and nursing note reviewed.  Constitutional:      Appearance: He is well-developed.  HENT:     Head: Normocephalic and atraumatic.  Eyes:     General: No scleral icterus.       Right eye: No discharge.        Left eye: No discharge.     Conjunctiva/sclera: Conjunctivae normal.     Pupils: Pupils are equal, round, and reactive to light.  Neck:     Musculoskeletal: Normal range of motion.     Vascular: No JVD.     Trachea: No tracheal deviation.  Pulmonary:     Effort: Pulmonary effort is normal.     Breath sounds: No stridor.  Musculoskeletal:     Comments: Swelling noted to the left ankle no redness or warmth to touch, tenderness along the medial malleolus, no significant laxity cap refill intact remainder of leg without swelling or edema  Neurological:     Mental Status: He is alert and oriented to person, place, and time.     Coordination: Coordination normal.  Psychiatric:  Behavior: Behavior normal.        Thought Content: Thought content normal.        Judgment: Judgment normal.      ED Treatments / Results  Labs (all labs ordered are listed, but only abnormal results are displayed) Labs Reviewed - No data to display  EKG None  Radiology Dg Ankle Complete Left  Result Date: 05/07/2019 CLINICAL DATA:  Fall, left ankle pain EXAM: LEFT ANKLE COMPLETE - 3+ VIEW COMPARISON:  None FINDINGS: Soft tissue swelling. No acute bony abnormality. Specifically, no fracture, subluxation, or dislocation. IMPRESSION: No acute bony abnormality. Electronically Signed   By: Rolm Baptise M.D.   On: 05/07/2019 20:08   Dg Foot Complete Left  Result Date: 05/07/2019 CLINICAL DATA:  Object fell on foot.  Swelling in left foot, pain EXAM: LEFT FOOT - COMPLETE 3+ VIEW  COMPARISON:  None. FINDINGS: There is no evidence of fracture or dislocation. There is no evidence of arthropathy or other focal bone abnormality. Soft tissues are unremarkable. IMPRESSION: Negative. Electronically Signed   By: Rolm Baptise M.D.   On: 05/07/2019 20:08    Procedures Procedures (including critical care time)  Medications Ordered in ED Medications  ibuprofen (ADVIL) tablet 800 mg (has no administration in time range)     Initial Impression / Assessment and Plan / ED Course  I have reviewed the triage vital signs and the nursing notes.  Pertinent labs & imaging results that were available during my care of the patient were reviewed by me and considered in my medical decision making (see chart for details).        33 year old male presents today with ankle sprain.  Patient has been wearing an EMS splint that is not meant to be worn with weightbearing.  This is likely contributing to his ongoing swelling.  I will place him in an ASO given crutches and encouraged him to follow-up as an outpatient with orthopedist.  Patient verbalized understanding and agreement to this plan had no further questions or concerns.  Final Clinical Impressions(s) / ED Diagnoses   Final diagnoses:  Sprain of left ankle, unspecified ligament, initial encounter    ED Discharge Orders         Ordered    ibuprofen (ADVIL) 800 MG tablet  3 times daily     05/07/19 2037           Okey Regal, PA-C 05/07/19 2037    Veryl Speak, MD 05/07/19 2257

## 2019-05-07 NOTE — Discharge Instructions (Signed)
Please read attached information. If you experience any new or worsening signs or symptoms please return to the emergency room for evaluation. Please follow-up with your primary care provider or specialist as discussed. Please use medication prescribed only as directed and discontinue taking if you have any concerning signs or symptoms.   °

## 2019-06-06 ENCOUNTER — Other Ambulatory Visit: Payer: Self-pay

## 2019-06-06 ENCOUNTER — Encounter (HOSPITAL_COMMUNITY): Payer: Self-pay

## 2019-06-06 ENCOUNTER — Emergency Department (HOSPITAL_COMMUNITY)
Admission: EM | Admit: 2019-06-06 | Discharge: 2019-06-06 | Disposition: A | Discharge status: Home / Self Care | Payer: No Typology Code available for payment source | Attending: Emergency Medicine | Admitting: Emergency Medicine

## 2019-06-06 ENCOUNTER — Emergency Department (HOSPITAL_COMMUNITY): Payer: No Typology Code available for payment source

## 2019-06-06 DIAGNOSIS — R0602 Shortness of breath: Secondary | ICD-10-CM | POA: Diagnosis not present

## 2019-06-06 DIAGNOSIS — M25512 Pain in left shoulder: Secondary | ICD-10-CM | POA: Insufficient documentation

## 2019-06-06 DIAGNOSIS — F10929 Alcohol use, unspecified with intoxication, unspecified: Secondary | ICD-10-CM | POA: Insufficient documentation

## 2019-06-06 DIAGNOSIS — Y92410 Unspecified street and highway as the place of occurrence of the external cause: Secondary | ICD-10-CM | POA: Insufficient documentation

## 2019-06-06 DIAGNOSIS — Y93I9 Activity, other involving external motion: Secondary | ICD-10-CM | POA: Insufficient documentation

## 2019-06-06 DIAGNOSIS — Z791 Long term (current) use of non-steroidal anti-inflammatories (NSAID): Secondary | ICD-10-CM | POA: Insufficient documentation

## 2019-06-06 DIAGNOSIS — Y999 Unspecified external cause status: Secondary | ICD-10-CM | POA: Diagnosis not present

## 2019-06-06 DIAGNOSIS — F17210 Nicotine dependence, cigarettes, uncomplicated: Secondary | ICD-10-CM | POA: Insufficient documentation

## 2019-06-06 DIAGNOSIS — F10920 Alcohol use, unspecified with intoxication, uncomplicated: Secondary | ICD-10-CM
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
## 2019-06-06 NOTE — ED Notes (Signed)
Patient verbalizes understanding of discharge instructions. Opportunity for questioning and answers were provided. Armband removed by staff, pt discharged from ED via taxi.  
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
## 2019-06-06 NOTE — Discharge Instructions (Addendum)
Thank you for allowing me to care for you today. Please return to the emergency department if you have new or worsening symptoms. Take your medications as instructed.  ° °

## 2019-07-05 ENCOUNTER — Inpatient Hospital Stay: Payer: Self-pay | Admitting: Internal Medicine

## 2019-07-05 ENCOUNTER — Other Ambulatory Visit: Payer: Self-pay

## 2019-09-04 ENCOUNTER — Other Ambulatory Visit: Payer: Self-pay

## 2019-09-04 ENCOUNTER — Encounter (HOSPITAL_COMMUNITY): Payer: Self-pay | Admitting: Emergency Medicine

## 2019-09-04 ENCOUNTER — Emergency Department (HOSPITAL_COMMUNITY)
Admission: EM | Admit: 2019-09-04 | Discharge: 2019-09-04 | Disposition: A | Discharge status: Home / Self Care | Payer: Self-pay | Attending: Emergency Medicine | Admitting: Emergency Medicine

## 2019-09-04 DIAGNOSIS — F17210 Nicotine dependence, cigarettes, uncomplicated: Secondary | ICD-10-CM | POA: Insufficient documentation

## 2019-09-04 DIAGNOSIS — M67432 Ganglion, left wrist: Secondary | ICD-10-CM | POA: Insufficient documentation

## 2019-09-04 MED ORDER — ACETAMINOPHEN 500 MG PO TABS
1000.0000 mg | ORAL_TABLET | Freq: Once | ORAL | Status: AC
  Administered 2019-09-04: 12:00:00 1000 mg via ORAL
  Filled 2019-09-04: qty 2

## 2019-09-04 NOTE — ED Provider Notes (Signed)
[facility] EMERGENCY DEPARTMENT Provider Note   CSN: [id number] Arrival date & time: 09/04/19  1032     History Chief Complaint  Patient presents with  . Cyst    Eugene Gonzalez is a 34 y.o. male.  HPI Patient.-year-old male presented today with left dorsum of wrist lump which has been waxing and waning but constantly there for the past few years.  States that it is sometimes worse in the winter.  Patient states that he was at work today and his employer recommended that he be seen by a medical professional and be evaluated.  Patient states that it is no worse today than usual.  States that it has been large in the past.  States it is nonpainful.  Does not restrict his wrist motion and he has normal sensation and strength in his hands without any weakness or functional issues.  Patient denies any history of skin infections.  Denies any fevers or chills.  Patient has not been evaluated for this in the past is not the primary care doctor.     History reviewed. No pertinent past medical history.  There are no problems to display for this patient.   History reviewed. No pertinent surgical history.     No family history on file.  Social History   Tobacco Use  . Smoking status: Current Every Day Smoker    Packs/day: 1.00    Types: Cigarettes  . Smokeless tobacco: Never Used  Substance Use Topics  . Alcohol use: Yes    Comment: refused to answer  . Drug use: No    Home Medications Prior to Admission medications   Medication Sig Start Date End Date Taking? Authorizing Provider  erythromycin ophthalmic ointment Place a 1/2 inch ribbon of ointment into the lower eyelid 4 times daily for 5 days. 02/25/19   Nuala Alpha A, PA-C  ibuprofen (ADVIL) 800 MG tablet Take 1 tablet (800 mg total) by mouth 3 (three) times daily. 05/07/19   Hedges, Dellis Filbert, PA-C  naproxen (NAPROSYN) 500 MG tablet Take 1 tablet (500 mg total) by mouth 2 (two) times daily with  a meal. 04/21/19   Caccavale, Sophia, PA-C    Allergies    Patient has no known allergies.  Review of Systems   Review of Systems  Constitutional: Negative for chills and fever.  HENT: Negative for congestion.   Eyes: Negative for pain.  Respiratory: Negative for cough and shortness of breath.   Cardiovascular: Negative for chest pain and leg swelling.  Gastrointestinal: Negative for abdominal pain and vomiting.  Genitourinary: Negative for dysuria.  Musculoskeletal: Negative for myalgias.       Lump on left wrist  Skin: Negative for rash.  Neurological: Negative for dizziness and headaches.    Physical Exam Updated Vital Signs BP 131/86   Pulse 65   Temp 98.4 F (36.9 C) (Oral)   Resp 17   SpO2 100%   Physical Exam Vitals and nursing note reviewed.  Constitutional:      General: He is not in acute distress.    Appearance: Normal appearance. He is not ill-appearing.  HENT:     Head: Normocephalic and atraumatic.  Eyes:     General: No scleral icterus.       Right eye: No discharge.        Left eye: No discharge.     Conjunctiva/sclera: Conjunctivae normal.  Cardiovascular:     Pulses: Normal pulses.     Comments: Symmetric radial pulses  Pulmonary:     Effort: Pulmonary effort is normal.     Breath sounds: No stridor.  Musculoskeletal:     Comments: Quarter sized raised lump that is raised elevated proximately 1 cm off of the dorsum of the wrist at the midline of the wrist joint.  Nontender to palpation.  No erythema, fluctuance or cellulitis.  Grip 5/5 BL  Skin:    General: Skin is warm and dry.     Capillary Refill: Capillary refill takes less than 2 seconds.  Neurological:     Mental Status: He is alert and oriented to person, place, and time. Mental status is at baseline.     Comments: Sensation intact bilateral hands all fingers tested.     ED Results / Procedures / Treatments   Labs (all labs ordered are listed, but only abnormal results are  displayed) Labs Reviewed - No data to display  EKG None  Radiology No results found.  Procedures Procedures (including critical care time)  Medications Ordered in ED Medications - No data to display  ED Course  I have reviewed the triage vital signs and the nursing notes.  Pertinent labs & imaging results that were available during my care of the patient were reviewed by me and considered in my medical decision making (see chart for details).    MDM Rules/Calculators/A&P                      Patient is a 34 year old well-appearing male with 2-year history of lump on wrist.  Appears to wax and wane in time but never completely disappears.  Has been worsening currently is in the past.  Has never been attempted to be drained.  He has no infectious symptoms.  No neurovascular or sensation compromise.  Consistent with gangion cyst. Gave pt referral to PCP at [facility] and information for hand surgery for follow up.  No abscess to drain. Bedside US without fluid collection.   Patient given tylenol and recommendations for tylenol and ibu at home.     The medical records were personally reviewed by myself. I personally reviewed all lab results and interpreted all imaging studies and either concurred with their official read or contacted radiology for clarification.   This patient appears reasonably screened and I doubt any other medical condition requiring further workup, evaluation, or treatment in the ED at this time prior to discharge.   Patient's vitals are WNL apart from vital sign abnormalities discussed above, patient is in NAD, and able to ambulate in the ED at their baseline and able to tolerate PO.  Pain has been managed or a plan has been made for home management and has no complaints prior to discharge. Patient is comfortable with above plan and for discharge at this time. All questions were answered prior to disposition. Results from the ER workup discussed with the patient  face to face and all questions answered to the best of my ability. The patient is safe for discharge with strict return precautions. Patient appears safe for discharge with appropriate follow-up. Conveyed my impression with the patient and they voiced understanding and are agreeable to plan.   An After Visit Summary was printed and given to the patient.  Portions of this note were generated with Scientist, clinical (histocompatibility and immunogenetics). Dictation errors may occur despite best attempts at proofreading.    Final Clinical Impression(s) / ED Diagnoses Final diagnoses:  None    Rx / DC Orders ED Discharge Orders  None       Gailen Shelter, [place] 09/04/19 1139    Blane Ohara, MD 09/05/19 [phone]

## 2019-09-04 NOTE — ED Triage Notes (Signed)
C/o cyst to posterior L wrist x 2 years that is worse when it is cold weather.

## 2019-09-04 NOTE — Discharge Instructions (Addendum)
Use as little tylenol and ibuprofen as needed to control pain.   Please use Tylenol or ibuprofen for pain.  You may use 600 mg ibuprofen every 6 hours or 1000 mg of Tylenol every 6 hours.  You may choose to alternate between the 2.  This would be most effective.  Not to exceed 4 g of Tylenol within 24 hours.  Not to exceed 3200 mg ibuprofen 24 hours.

## 2019-09-30 ENCOUNTER — Emergency Department (HOSPITAL_COMMUNITY): Payer: No Typology Code available for payment source

## 2019-09-30 ENCOUNTER — Emergency Department (HOSPITAL_COMMUNITY)
Admission: EM | Admit: 2019-09-30 | Discharge: 2019-09-30 | Disposition: A | Discharge status: Home / Self Care | Payer: No Typology Code available for payment source | Attending: Emergency Medicine | Admitting: Emergency Medicine

## 2019-09-30 ENCOUNTER — Other Ambulatory Visit: Payer: Self-pay

## 2019-09-30 DIAGNOSIS — I10 Essential (primary) hypertension: Secondary | ICD-10-CM

## 2019-09-30 DIAGNOSIS — M25511 Pain in right shoulder: Secondary | ICD-10-CM | POA: Diagnosis not present

## 2019-09-30 DIAGNOSIS — Y908 Blood alcohol level of 240 mg/100 ml or more: Secondary | ICD-10-CM | POA: Diagnosis not present

## 2019-09-30 DIAGNOSIS — F17210 Nicotine dependence, cigarettes, uncomplicated: Secondary | ICD-10-CM | POA: Diagnosis not present

## 2019-09-30 DIAGNOSIS — F10929 Alcohol use, unspecified with intoxication, unspecified: Secondary | ICD-10-CM | POA: Diagnosis not present

## 2019-09-30 DIAGNOSIS — Y92410 Unspecified street and highway as the place of occurrence of the external cause: Secondary | ICD-10-CM | POA: Diagnosis not present

## 2019-09-30 DIAGNOSIS — R030 Elevated blood-pressure reading, without diagnosis of hypertension: Secondary | ICD-10-CM | POA: Diagnosis not present

## 2019-09-30 DIAGNOSIS — G44309 Post-traumatic headache, unspecified, not intractable: Secondary | ICD-10-CM | POA: Insufficient documentation

## 2019-09-30 DIAGNOSIS — M542 Cervicalgia: Secondary | ICD-10-CM | POA: Diagnosis present

## 2019-09-30 DIAGNOSIS — Y9389 Activity, other specified: Secondary | ICD-10-CM | POA: Diagnosis not present

## 2019-09-30 DIAGNOSIS — Y999 Unspecified external cause status: Secondary | ICD-10-CM | POA: Diagnosis not present

## 2019-09-30 DIAGNOSIS — F10920 Alcohol use, unspecified with intoxication, uncomplicated: Secondary | ICD-10-CM

## 2019-09-30 LAB — COMPREHENSIVE METABOLIC PANEL
ALT: 105 U/L — ABNORMAL HIGH (ref 0–44)
AST: 219 U/L — ABNORMAL HIGH (ref 15–41)
Albumin: 4.9 g/dL (ref 3.5–5.0)
Alkaline Phosphatase: 86 U/L (ref 38–126)
Anion gap: 17 — ABNORMAL HIGH (ref 5–15)
BUN: 7 mg/dL (ref 6–20)
CO2: 21 mmol/L — ABNORMAL LOW (ref 22–32)
Calcium: 9.1 mg/dL (ref 8.9–10.3)
Chloride: 102 mmol/L (ref 98–111)
Creatinine, Ser: 0.99 mg/dL (ref 0.61–1.24)
GFR calc Af Amer: >60 mL/min (ref >60)
GFR calc non Af Amer: >60 mL/min (ref >60)
Glucose, Bld: 95 mg/dL (ref 70–99)
Potassium: 4.4 mmol/L (ref 3.5–5.1)
Sodium: 140 mmol/L (ref 135–145)
Total Bilirubin: 0.9 mg/dL (ref 0.3–1.2)
Total Protein: 8.2 g/dL — ABNORMAL HIGH (ref 6.5–8.1)

## 2019-09-30 LAB — CBC WITH DIFFERENTIAL/PLATELET
Abs Immature Granulocytes: 0.01 10*3/uL (ref 0.00–0.07)
Basophils Absolute: 0.1 10*3/uL (ref 0.0–0.1)
Basophils Relative: 1 %
Eosinophils Absolute: 0 10*3/uL (ref 0.0–0.5)
Eosinophils Relative: 1 %
HCT: 49.4 % (ref 39.0–52.0)
Hemoglobin: 16.1 g/dL (ref 13.0–17.0)
Immature Granulocytes: 0 %
Lymphocytes Relative: 41 %
Lymphs Abs: 2.4 10*3/uL (ref 0.7–4.0)
MCH: 29 pg (ref 26.0–34.0)
MCHC: 32.6 g/dL (ref 30.0–36.0)
MCV: 89 fL (ref 80.0–100.0)
Monocytes Absolute: 0.5 10*3/uL (ref 0.1–1.0)
Monocytes Relative: 8 %
Neutro Abs: 2.9 10*3/uL (ref 1.7–7.7)
Neutrophils Relative %: 49 %
Platelets: 246 10*3/uL (ref 150–400)
RBC: 5.55 MIL/uL (ref 4.22–5.81)
RDW: 14.7 % (ref 11.5–15.5)
WBC: 5.8 10*3/uL (ref 4.0–10.5)
nRBC: 0 % (ref 0.0–0.2)

## 2019-09-30 LAB — ETHANOL: Alcohol, Ethyl (B): 266 mg/dL — ABNORMAL HIGH (ref <10)

## 2019-09-30 MED ORDER — SODIUM CHLORIDE 0.9 % IV BOLUS: 1000.0000 mL | Freq: Once | INTRAVENOUS | Status: DC
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
## 2020-10-21 ENCOUNTER — Encounter (HOSPITAL_COMMUNITY): Payer: Self-pay | Admitting: Emergency Medicine

## 2020-10-21 ENCOUNTER — Emergency Department (HOSPITAL_COMMUNITY): Payer: Self-pay

## 2020-10-21 ENCOUNTER — Emergency Department (HOSPITAL_COMMUNITY)
Admission: EM | Admit: 2020-10-21 | Discharge: 2020-10-21 | Disposition: A | Discharge status: Home / Self Care | Payer: Self-pay | Attending: Emergency Medicine | Admitting: Emergency Medicine

## 2020-10-21 ENCOUNTER — Other Ambulatory Visit: Payer: Self-pay

## 2020-10-21 DIAGNOSIS — S31119A Laceration without foreign body of abdominal wall, unspecified quadrant without penetration into peritoneal cavity, initial encounter: Secondary | ICD-10-CM | POA: Insufficient documentation

## 2020-10-21 DIAGNOSIS — Z23 Encounter for immunization: Secondary | ICD-10-CM | POA: Insufficient documentation

## 2020-10-21 DIAGNOSIS — T1490XA Injury, unspecified, initial encounter: Secondary | ICD-10-CM

## 2020-10-21 DIAGNOSIS — T148XXA Other injury of unspecified body region, initial encounter: Secondary | ICD-10-CM

## 2020-10-21 DIAGNOSIS — T07XXXA Unspecified multiple injuries, initial encounter: Secondary | ICD-10-CM

## 2020-10-21 DIAGNOSIS — X781XXA Intentional self-harm by knife, initial encounter: Secondary | ICD-10-CM | POA: Insufficient documentation

## 2020-10-21 DIAGNOSIS — Z20822 Contact with and (suspected) exposure to covid-19: Secondary | ICD-10-CM | POA: Insufficient documentation

## 2020-10-21 DIAGNOSIS — S41112A Laceration without foreign body of left upper arm, initial encounter: Secondary | ICD-10-CM | POA: Insufficient documentation

## 2020-10-21 DIAGNOSIS — S21112A Laceration without foreign body of left front wall of thorax without penetration into thoracic cavity, initial encounter: Secondary | ICD-10-CM | POA: Insufficient documentation

## 2020-10-21 LAB — CBC
HCT: 38.7 % — ABNORMAL LOW (ref 39.0–52.0)
Hemoglobin: 11.1 g/dL — ABNORMAL LOW (ref 13.0–17.0)
MCH: 21.8 pg — ABNORMAL LOW (ref 26.0–34.0)
MCHC: 28.7 g/dL — ABNORMAL LOW (ref 30.0–36.0)
MCV: 76 fL — ABNORMAL LOW (ref 80.0–100.0)
Platelets: 225 10*3/uL (ref 150–400)
RBC: 5.09 MIL/uL (ref 4.22–5.81)
RDW: 20.7 % — ABNORMAL HIGH (ref 11.5–15.5)
WBC: 4.7 10*3/uL (ref 4.0–10.5)
nRBC: 0 % (ref 0.0–0.2)

## 2020-10-21 LAB — I-STAT CHEM 8, ED
BUN: 4 mg/dL — ABNORMAL LOW (ref 6–20)
Calcium, Ion: 0.98 mmol/L — ABNORMAL LOW (ref 1.15–1.40)
Chloride: 104 mmol/L (ref 98–111)
Creatinine, Ser: 1.7 mg/dL — ABNORMAL HIGH (ref 0.61–1.24)
Glucose, Bld: 106 mg/dL — ABNORMAL HIGH (ref 70–99)
HCT: 39 % (ref 39.0–52.0)
Hemoglobin: 13.3 g/dL (ref 13.0–17.0)
Potassium: 3.9 mmol/L (ref 3.5–5.1)
Sodium: 142 mmol/L (ref 135–145)
TCO2: 23 mmol/L (ref 22–32)

## 2020-10-21 LAB — URINALYSIS, ROUTINE W REFLEX MICROSCOPIC
Bilirubin Urine: NEGATIVE
Glucose, UA: NEGATIVE mg/dL
Hgb urine dipstick: NEGATIVE
Ketones, ur: NEGATIVE mg/dL
Leukocytes,Ua: NEGATIVE
Nitrite: NEGATIVE
Protein, ur: NEGATIVE mg/dL
Specific Gravity, Urine: 1.012 (ref 1.005–1.030)
pH: 6 (ref 5.0–8.0)

## 2020-10-21 LAB — SAMPLE TO BLOOD BANK

## 2020-10-21 LAB — COMPREHENSIVE METABOLIC PANEL
ALT: 25 U/L (ref 0–44)
AST: 40 U/L (ref 15–41)
Albumin: 4.3 g/dL (ref 3.5–5.0)
Alkaline Phosphatase: 55 U/L (ref 38–126)
Anion gap: 13 (ref 5–15)
BUN: 5 mg/dL — ABNORMAL LOW (ref 6–20)
CO2: 21 mmol/L — ABNORMAL LOW (ref 22–32)
Calcium: 9.2 mg/dL (ref 8.9–10.3)
Chloride: 105 mmol/L (ref 98–111)
Creatinine, Ser: 1.24 mg/dL (ref 0.61–1.24)
GFR, Estimated: >60 mL/min (ref >60)
Glucose, Bld: 104 mg/dL — ABNORMAL HIGH (ref 70–99)
Potassium: 3.7 mmol/L (ref 3.5–5.1)
Sodium: 139 mmol/L (ref 135–145)
Total Bilirubin: 0.5 mg/dL (ref 0.3–1.2)
Total Protein: 7.5 g/dL (ref 6.5–8.1)

## 2020-10-21 LAB — ETHANOL: Alcohol, Ethyl (B): 310 mg/dL (ref <10)

## 2020-10-21 LAB — LACTIC ACID, PLASMA: Lactic Acid, Venous: 4.8 mmol/L (ref 0.5–1.9)

## 2020-10-21 LAB — PROTIME-INR
INR: 1 (ref 0.8–1.2)
Prothrombin Time: 13.1 seconds (ref 11.4–15.2)

## 2020-10-21 LAB — RESP PANEL BY RT-PCR (FLU A&B, COVID) ARPGX2
Influenza A by PCR: NEGATIVE
Influenza B by PCR: NEGATIVE
SARS Coronavirus 2 by RT PCR: NEGATIVE

## 2020-10-21 LAB — CDS SEROLOGY

## 2020-10-21 MED ORDER — CEPHALEXIN 250 MG PO CAPS
250.0000 mg | ORAL_CAPSULE | Freq: Once | ORAL | Status: AC
  Administered 2020-10-21: 250 mg via ORAL
  Filled 2020-10-21: qty 1

## 2020-10-21 MED ORDER — SODIUM CHLORIDE 0.9 % IV BOLUS
1000.0000 mL | Freq: Once | INTRAVENOUS | Status: AC
  Administered 2020-10-21: 1000 mL via INTRAVENOUS

## 2020-10-21 MED ORDER — CEPHALEXIN 250 MG PO CAPS: 250.0000 mg | ORAL_CAPSULE | Freq: Three times a day (TID) | ORAL | 0 refills | Status: DC

## 2020-10-21 MED ORDER — SODIUM CHLORIDE 0.9 % IV SOLN: INTRAVENOUS | Status: DC

## 2020-10-21 MED ORDER — TETANUS-DIPHTH-ACELL PERTUSSIS 5-2.5-18.5 LF-MCG/0.5 IM SUSY
0.5000 mL | PREFILLED_SYRINGE | Freq: Once | INTRAMUSCULAR | Status: AC
  Administered 2020-10-21: 0.5 mL via INTRAMUSCULAR
  Filled 2020-10-21: qty 0.5

## 2020-10-21 MED ORDER — CEPHALEXIN 250 MG PO CAPS: 250.0000 mg | ORAL_CAPSULE | Freq: Three times a day (TID) | ORAL | 0 refills | Status: AC

## 2020-10-21 MED ORDER — IOHEXOL 300 MG/ML  SOLN
100.0000 mL | Freq: Once | INTRAMUSCULAR | Status: AC | PRN
  Administered 2020-10-21: 100 mL via INTRAVENOUS
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
## 2020-10-21 NOTE — ED Notes (Signed)
Pt provided with paper scrubs, socks, and booties for discharge.
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
## 2023-03-15 ENCOUNTER — Encounter (HOSPITAL_COMMUNITY): Payer: Self-pay | Admitting: Emergency Medicine

## 2023-03-15 ENCOUNTER — Emergency Department (HOSPITAL_COMMUNITY)
Admission: EM | Admit: 2023-03-15 | Discharge: 2023-03-15 | Disposition: A | Discharge status: Home / Self Care | Payer: 59 | Attending: Emergency Medicine | Admitting: Emergency Medicine

## 2023-03-15 ENCOUNTER — Emergency Department (HOSPITAL_COMMUNITY): Payer: 59

## 2023-03-15 ENCOUNTER — Other Ambulatory Visit: Payer: Self-pay

## 2023-03-15 DIAGNOSIS — S52592A Other fractures of lower end of left radius, initial encounter for closed fracture: Secondary | ICD-10-CM | POA: Diagnosis not present

## 2023-03-15 DIAGNOSIS — Y990 Civilian activity done for income or pay: Secondary | ICD-10-CM | POA: Insufficient documentation

## 2023-03-15 DIAGNOSIS — W208XXA Other cause of strike by thrown, projected or falling object, initial encounter: Secondary | ICD-10-CM | POA: Insufficient documentation

## 2023-03-15 DIAGNOSIS — S52502A Unspecified fracture of the lower end of left radius, initial encounter for closed fracture: Secondary | ICD-10-CM

## 2023-03-15 DIAGNOSIS — S6992XA Unspecified injury of left wrist, hand and finger(s), initial encounter: Secondary | ICD-10-CM | POA: Diagnosis present

## 2023-03-15 MED ORDER — OXYCODONE-ACETAMINOPHEN 5-325 MG PO TABS
1.0000 | ORAL_TABLET | ORAL | Status: DC | PRN
  Administered 2023-03-15: 1 via ORAL
  Filled 2023-03-15: qty 1

## 2023-03-15 MED ORDER — OXYCODONE HCL 5 MG PO TABS: 5.0000 mg | ORAL_TABLET | ORAL | 0 refills | Status: AC | PRN

## 2023-03-15 NOTE — Progress Notes (Signed)
Orthopedic Tech Progress Note Patient Details:  Eugene Gonzalez 24-Apr-1986 [id number]  Ortho Devices Type of Ortho Device: Cotton web roll, Ace wrap, Sugartong splint, Shoulder immobilizer Ortho Device/Splint Location: LUE Ortho Device/Splint Interventions: Ordered, Application, Adjustment   Post Interventions Patient Tolerated: Well Instructions Provided: Care of device  Donald Pore 03/15/2023, 8:34 AM

## 2023-03-15 NOTE — ED Provider Notes (Signed)
[place] EMERGENCY DEPARTMENT AT [facility] Provider Note   CSN: [id number] Arrival date & time: 03/15/23  0404     History {Add pertinent medical, surgical, social history, OB history to HPI:1} Chief Complaint  Patient presents with   Hand Injury    Eugene Gonzalez is a 37 y.o. male.  HPI     Home Medications Prior to Admission medications   Medication Sig Start Date End Date Taking? Authorizing Provider  erythromycin ophthalmic ointment Place a 1/2 inch ribbon of ointment into the lower eyelid 4 times daily for 5 days. 02/25/19   Harlene Salts A, PA-C  ibuprofen (ADVIL) 200 MG tablet Take 400 mg by mouth every 6 (six) hours as needed for headache or moderate pain.    [provider]  ibuprofen (ADVIL) 800 MG tablet Take 1 tablet (800 mg total) by mouth 3 (three) times daily. 05/07/19   Hedges, Tinnie Gens, PA-C  naproxen (NAPROSYN) 500 MG tablet Take 1 tablet (500 mg total) by mouth 2 (two) times daily with a meal. 04/21/19   Caccavale, Sophia, PA-C  Polyethyl Glycol-Propyl Glycol (SYSTANE OP) Place 1 drop into both eyes daily.    [provider]      Allergies    Patient has no known allergies.    Review of Systems   Review of Systems  Physical Exam Updated Vital Signs BP (!) 137/98 (BP Location: Right Arm)   Pulse 78   Temp 98.4 F (36.9 C) (Oral)   Resp 17   Ht 5\' 10"  (1.778 m)   Wt 99.8 kg   SpO2 100%   BMI 31.57 kg/m  Physical Exam  ED Results / Procedures / Treatments   Labs (all labs ordered are listed, but only abnormal results are displayed) Labs Reviewed - No data to display  EKG None  Radiology DG Wrist Complete Left  Result Date: 03/15/2023 CLINICAL DATA:  Wrist injury 6 hours ago. EXAM: LEFT WRIST - COMPLETE 3 VIEW COMPARISON:  None Available. FINDINGS: Nondisplaced longitudinal fracture through the distal radius reaching the radiocarpal joint where there is 2 mm of fracture spacing. Chronic fragmentation  at the pisiform. There may have been a remote fifth metacarpal shaft fracture. IMPRESSION: Nondisplaced distal radius fracture reaching the radiocarpal joint. Electronically Signed   By: Tiburcio Pea M.D.   On: 03/15/2023 05:04   DG Hand Complete Left  Result Date: 03/15/2023 CLINICAL DATA:  Left hand injury with deformity.  Fall 6 hours ago. EXAM: LEFT HAND - COMPLETE 3 VIEW COMPARISON:  None Available. FINDINGS: Longitudinal fracture through the distal radius involving the radiocarpal joint. No measurable displacement. The wrist is located. Limited assessment of the distal fingers due to persisting flexion and overlap on the lateral view. Chronic posttraumatic deformity and blunting of the middle finger distal phalanx at least. IMPRESSION: Nondisplaced fracture of the distal radius. Electronically Signed   By: Tiburcio Pea M.D.   On: 03/15/2023 05:03    Procedures Procedures  {Document cardiac monitor, telemetry assessment procedure when appropriate:1}  Medications Ordered in ED Medications  oxyCODONE-acetaminophen (PERCOCET/ROXICET) 5-325 MG per tablet 1 tablet (1 tablet Oral Given 03/15/23 0547)    ED Course/ Medical Decision Making/ A&P   {   Click here for ABCD2, HEART and other calculatorsREFRESH Note before signing :1}                              Medical Decision Making Amount and/or  Complexity of Data Reviewed Radiology: ordered.  Risk Prescription drug management.   ***  {Document critical care time when appropriate:1} {Document review of labs and clinical decision tools ie heart score, Chads2Vasc2 etc:1}  {Document your independent review of radiology images, and any outside records:1} {Document your discussion with family members, caretakers, and with consultants:1} {Document social determinants of health affecting pt's care:1} {Document your decision making why or why not admission, treatments were needed:1} Final Clinical Impression(s) / ED Diagnoses Final  diagnoses:  None    Rx / DC Orders ED Discharge Orders     None

## 2023-03-15 NOTE — Discharge Instructions (Addendum)
You may take tylenol and ibuprofen (with food) primarily, use the oxycodone prescription for breakthrough pain.
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
